# Patient Record
Sex: Male | Born: 1975 | Race: White | Hispanic: No | Marital: Single | Smoking: Current every day smoker
Health system: Southern US, Community
[De-identification: ages and names within clinical notes are randomized; demographics above are authoritative.]

## PROBLEM LIST (undated history)

## (undated) HISTORY — PX: ANKLE SURGERY: SHX546

## (undated) HISTORY — PX: NECK SURGERY: SHX720

---

## 1998-05-23 ENCOUNTER — Inpatient Hospital Stay (HOSPITAL_COMMUNITY): Admission: EM | Admit: 1998-05-23 | Discharge: 1998-05-26 | Payer: Self-pay | Admitting: Emergency Medicine

## 1998-05-23 ENCOUNTER — Encounter: Payer: Self-pay | Admitting: Emergency Medicine

## 1998-05-24 ENCOUNTER — Encounter: Payer: Self-pay | Admitting: Neurosurgery

## 1998-05-25 ENCOUNTER — Encounter: Payer: Self-pay | Admitting: Neurosurgery

## 2012-04-30 ENCOUNTER — Encounter (HOSPITAL_COMMUNITY): Payer: Self-pay | Admitting: Cardiology

## 2012-04-30 ENCOUNTER — Emergency Department (HOSPITAL_COMMUNITY)
Admission: EM | Admit: 2012-04-30 | Discharge: 2012-04-30 | Discharge status: Against Medical Advice | Payer: Self-pay | Attending: Emergency Medicine | Admitting: Emergency Medicine

## 2012-04-30 DIAGNOSIS — M795 Residual foreign body in soft tissue: Secondary | ICD-10-CM | POA: Insufficient documentation

## 2012-04-30 DIAGNOSIS — L089 Local infection of the skin and subcutaneous tissue, unspecified: Secondary | ICD-10-CM | POA: Insufficient documentation

## 2012-04-30 DIAGNOSIS — X58XXXA Exposure to other specified factors, initial encounter: Secondary | ICD-10-CM | POA: Insufficient documentation

## 2012-04-30 DIAGNOSIS — Y939 Activity, unspecified: Secondary | ICD-10-CM | POA: Insufficient documentation

## 2012-04-30 DIAGNOSIS — Y990 Civilian activity done for income or pay: Secondary | ICD-10-CM | POA: Insufficient documentation

## 2012-04-30 DIAGNOSIS — F17200 Nicotine dependence, unspecified, uncomplicated: Secondary | ICD-10-CM | POA: Insufficient documentation

## 2012-04-30 DIAGNOSIS — T148XXA Other injury of unspecified body region, initial encounter: Secondary | ICD-10-CM | POA: Insufficient documentation

## 2012-04-30 DIAGNOSIS — Y9269 Other specified industrial and construction area as the place of occurrence of the external cause: Secondary | ICD-10-CM | POA: Insufficient documentation

## 2012-04-30 NOTE — ED Notes (Signed)
Pt not in room when transporter came to transport for X ray, will recheck x 5 mins to assess if the pt has returned to the room

## 2012-04-30 NOTE — ED Notes (Signed)
Walnut size lump on rt upper arm that has been getting redder since Friday was seen recently for back pain pt  Denies itching or injury

## 2012-04-30 NOTE — ED Notes (Signed)
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 Small knot and redness noted to the area.

## 2012-04-30 NOTE — ED Notes (Addendum)
Pt is not in room, pt removed LWBS in system

## 2012-07-01 ENCOUNTER — Emergency Department (HOSPITAL_COMMUNITY)
Admission: EM | Admit: 2012-07-01 | Discharge: 2012-07-01 | Disposition: A | Discharge status: Home / Self Care | Payer: Self-pay | Attending: Emergency Medicine | Admitting: Emergency Medicine

## 2012-07-01 ENCOUNTER — Encounter (HOSPITAL_COMMUNITY): Payer: Self-pay | Admitting: Emergency Medicine

## 2012-07-01 DIAGNOSIS — F17200 Nicotine dependence, unspecified, uncomplicated: Secondary | ICD-10-CM | POA: Insufficient documentation

## 2012-07-01 DIAGNOSIS — M545 Low back pain, unspecified: Secondary | ICD-10-CM | POA: Insufficient documentation

## 2012-07-01 MED ORDER — KETOROLAC TROMETHAMINE 60 MG/2ML IM SOLN
60.0000 mg | Freq: Once | INTRAMUSCULAR | Status: AC
  Administered 2012-07-01: 60 mg via INTRAMUSCULAR
  Filled 2012-07-01: qty 2

## 2012-07-01 MED ORDER — METHOCARBAMOL 500 MG PO TABS
500.0000 mg | ORAL_TABLET | Freq: Once | ORAL | Status: AC
  Administered 2012-07-01: 500 mg via ORAL
  Filled 2012-07-01: qty 1

## 2012-07-01 MED ORDER — METHOCARBAMOL 500 MG PO TABS: 500.0000 mg | ORAL_TABLET | Freq: Two times a day (BID) | ORAL | Status: DC

## 2012-07-01 MED ORDER — TRAMADOL HCL 50 MG PO TABS: 50.0000 mg | ORAL_TABLET | Freq: Four times a day (QID) | ORAL | Status: DC | PRN

## 2012-07-01 MED ORDER — IBUPROFEN 600 MG PO TABS: 600.0000 mg | ORAL_TABLET | Freq: Four times a day (QID) | ORAL | Status: DC | PRN

## 2012-07-01 NOTE — ED Notes (Signed)
Pt reports about 2 months ago while he was at work he was lifting some heavy tired and putting them on a trailer and felt liket he twisted something, didn't hurt too bad then a few days later became in more pain went to an urgent care and was given prednisone and robaxin but didn't get much relief, sts no xray was performed. Pt sts he has even bought oxycodone off the street because the pain is so bad. Pt sts he thinks its more than just a pulled muscle. Pt c/o pain to lower back. Pt in nad, skin warm and dry, resp e/u. Ambulatory to room.

## 2012-07-01 NOTE — ED Provider Notes (Signed)
History  This chart was scribed for Jerry Racer, MD by Shari Heritage, ED Scribe. The patient was seen in room TR04C/TR04C. Patient's care was started at 1657.   CSN: [id number]  Arrival date & time 07/01/12  1549   First MD Initiated Contact with Patient 07/01/12 1657      Chief Complaint  Patient presents with  . Back Pain    Patient is a 37 y.o. male presenting with back pain. The history is provided by the patient. No language interpreter was used.  Back Pain Location:  Lumbar spine Radiates to:  L shoulder Pain severity now: moderate to severe. Pain is:  Same all the time Duration:  4 months Timing:  Intermittent Progression:  Waxing and waning Chronicity:  Recurrent Context: occupational injury   Ineffective treatments: prednisone, Meloxicam. Associated symptoms: no bladder incontinence, no bowel incontinence, no fever, no numbness and no weakness     HPI Comments: [name] is a 37 y.o. male who presents to the Emergency Department complaining of intermittent, waxing and waning, moderate to severe lower back pain for the past 4 months. 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 He says that pain gradually worsened for several hours after lifting a tire at work. Patient reports that pain began on the right side and is now bilateral. He states that pain is worse with ambulation and certain movements. He has been seen for this problem and was treated with meloxicam and prednisone twice, but pain has not improved. He denies bowel or bladder incontinence, numbness and weakness of extremities or fever and chills. Patient says that he has been unable to work due to severity of the pain. He has not seen an orthopedist or neurologist for this problem due to lack of funds or insurance. He says that he is in the process of filing workman's compensation. Patient has no chronic medical conditions. He is a current every day smoker.  History reviewed. No pertinent past  medical history.  Past Surgical History  Procedure Laterality Date  . Neck surgery    . Ankle surgery      History reviewed. No pertinent family history.  History  Substance Use Topics  . Smoking status: Current Every Day Smoker  . Smokeless tobacco: Not on file  . Alcohol Use: Yes      Review of Systems  Constitutional: Negative for fever and chills.  Gastrointestinal: Negative for bowel incontinence.  Genitourinary: Negative for bladder incontinence.  Musculoskeletal: Positive for back pain.  Neurological: Negative for weakness and numbness.    Allergies  Review of patient's allergies indicates no known allergies.  Home Medications   Current Outpatient Rx  Name  Route  Sig  Dispense  Refill  . ibuprofen (ADVIL,MOTRIN) 600 MG tablet   Oral   Take 1 tablet (600 mg total) by mouth every 6 (six) hours as needed for pain.   30 tablet   0   . methocarbamol (ROBAXIN) 500 MG tablet   Oral   Take 1 tablet (500 mg total) by mouth 2 (two) times daily.   20 tablet   0   . traMADol (ULTRAM) 50 MG tablet   Oral   Take 1 tablet (50 mg total) by mouth every 6 (six) hours as needed for pain.   15 tablet   0     Triage Vitals: BP 117/80  Pulse 89  Temp(Src) 98.2 F (36.8 C)  Resp 21  SpO2 98%  Physical Exam  Constitutional: He is  oriented to person, place, and time. He appears well-developed and well-nourished.  HENT:  Head: Normocephalic and atraumatic.  Eyes: Conjunctivae and EOM are normal. Pupils are equal, round, and reactive to light.  Neck: Normal range of motion. Neck supple.  Pulmonary/Chest: No respiratory distress.  Musculoskeletal: Normal range of motion. He exhibits tenderness.  Generalized lumbar tenderness to palpation. No midline tenderness. Negative straight leg raise bilaterally. Neurovascularly intact.   Neurological: He is alert and oriented to person, place, and time.  Skin: Skin is warm and dry.    ED Course  Procedures (including  critical care time) DIAGNOSTIC STUDIES: Oxygen Saturation is 98% on room air, normal by my interpretation.    COORDINATION OF CARE: 5:06 PM- Patient informed of current plan for treatment and evaluation and agrees with plan at this time.    1. Low back pain       MDM  I personally performed the services described in this documentation, which was scribed in my presence. The recorded information has been reviewed and is accurate.     Jerry Racer, MD 07/01/12 Mikle [place]worth

## 2012-07-01 NOTE — ED Notes (Signed)
Pt c/o lower back pain x 2 months after lifting tire at work

## 2012-09-14 ENCOUNTER — Encounter (HOSPITAL_COMMUNITY): Payer: Self-pay | Admitting: Physical Medicine and Rehabilitation

## 2012-09-14 ENCOUNTER — Emergency Department (HOSPITAL_COMMUNITY)
Admission: EM | Admit: 2012-09-14 | Discharge: 2012-09-14 | Disposition: A | Discharge status: Home / Self Care | Payer: Self-pay | Attending: Emergency Medicine | Admitting: Emergency Medicine

## 2012-09-14 DIAGNOSIS — IMO0002 Reserved for concepts with insufficient information to code with codable children: Secondary | ICD-10-CM | POA: Insufficient documentation

## 2012-09-14 DIAGNOSIS — L0291 Cutaneous abscess, unspecified: Secondary | ICD-10-CM

## 2012-09-14 DIAGNOSIS — F17200 Nicotine dependence, unspecified, uncomplicated: Secondary | ICD-10-CM | POA: Insufficient documentation

## 2012-09-14 MED ORDER — OXYCODONE-ACETAMINOPHEN 5-325 MG PO TABS: 1.0000 | ORAL_TABLET | Freq: Four times a day (QID) | ORAL | Status: DC | PRN

## 2012-09-14 NOTE — ED Notes (Signed)
PA at bedside for I & D.

## 2012-09-14 NOTE — ED Provider Notes (Signed)
CSN: [id number]     Arrival date & time 09/14/12  1222 History     First MD Initiated Contact with Patient 09/14/12 1258     Chief Complaint  Patient presents with  . Abscess   (Consider location/radiation/quality/duration/timing/severity/associated sxs/prior Treatment) HPI Comments: Patient presents with an abscess of his left forearm.  He reports that the abscess has been present for the past month and is gradually worsening.  He was seen in the ED in [place] for this three days ago.  At that time he was given a prescription for a ten day course of Bactrim DS, which he states he has been taking.  He was also given a prescription for Percocet for the pain, which he reports did help.  However, he ran out of the medication.   He reports no improvement in the abscess since that time.  He denies any drainage from the area.  Denies fever or chills.  Denies nausea or vomiting.  He has full ROM of his left elbow.  He denies prior history of DM or HIV.  Patient is a 37 y.o. male presenting with abscess. The history is provided by the patient.  Abscess Associated symptoms: no fever     No past medical history on file. Past Surgical History  Procedure Laterality Date  . Neck surgery    . Ankle surgery     History reviewed. No pertinent family history. History  Substance Use Topics  . Smoking status: Current Every Day Smoker  . Smokeless tobacco: Not on file  . Alcohol Use: Yes     Comment: socially    Review of Systems  Constitutional: Negative for fever and chills.  Skin:       abscess  All other systems reviewed and are negative.    Allergies  Review of patient's allergies indicates no known allergies.  Home Medications  No current outpatient prescriptions on file. BP 124/72  Pulse 105  Temp(Src) 98.2 F (36.8 C) (Oral)  Resp 18  SpO2 100% Physical Exam  Nursing note and vitals reviewed. Constitutional: He is oriented to person, place, and time. He appears  well-developed and well-nourished.  HENT:  Head: Normocephalic and atraumatic.  Neck: Normal range of motion. Neck supple.  Cardiovascular: Normal rate, regular rhythm and normal heart sounds.   Pulses:      Radial pulses are 2+ on the right side, and 2+ on the left side.  Pulmonary/Chest: Effort normal and breath sounds normal.  Musculoskeletal: Normal range of motion.  Full ROM of the left elbow  Neurological: He is alert and oriented to person, place, and time. No sensory deficit.  Skin: Skin is warm and dry.     Psychiatric: He has a normal mood and affect.    ED Course   Procedures (including critical care time)  Labs Reviewed - No data to display No results found. No diagnosis found.  INCISION AND DRAINAGE Performed by: Anne Shutter, Cadan Maggart Consent: Verbal consent obtained. Risks and benefits: risks, benefits and alternatives were discussed Type: abscess  Body area: left forearm  Anesthesia: local infiltration  Incision was made with a scalpel.  Local anesthetic: lidocaine 2% with epinephrine  Anesthetic total:4  ml  Complexity: complex Blunt dissection to break up loculations  Drainage: purulent  Drainage amount: copious   Patient tolerance: Patient tolerated the procedure well with no immediate complications.    MDM  Patient with skin abscess amenable to incision and drainage.  Patient is afebrile.  Full ROM of  the left elbow.   Encouraged warm compresses.    Mild signs of cellulitis is surrounding skin.  Will d/c to home.  Patient instructed to continue his antibiotic.  Strict return precautions given.  Pascal Lux [place], PA-C 09/14/12 1601

## 2012-09-14 NOTE — ED Notes (Signed)
Pt presents to department for evaluation of L arm abscess. Pt states raised, red and swollen area to L elbow region. Has been taking antibiotics and pain medication without relief. Now states area is warm and touch and becoming more painful. 10/10 pain at the time. No drainage noted.

## 2012-09-19 NOTE — ED Provider Notes (Signed)
Medical screening examination/treatment/procedure(s) were performed by non-physician practitioner and as supervising physician I was immediately available for consultation/collaboration.   Charles B. Sheldon, MD 09/19/12 0720 

## 2014-04-24 ENCOUNTER — Emergency Department (HOSPITAL_COMMUNITY): Payer: Self-pay

## 2014-04-24 ENCOUNTER — Emergency Department (HOSPITAL_COMMUNITY)
Admission: EM | Admit: 2014-04-24 | Discharge: 2014-04-24 | Disposition: A | Discharge status: Home / Self Care | Payer: Self-pay | Attending: Emergency Medicine | Admitting: Emergency Medicine

## 2014-04-24 ENCOUNTER — Encounter (HOSPITAL_COMMUNITY): Payer: Self-pay | Admitting: *Deleted

## 2014-04-24 DIAGNOSIS — S40011A Contusion of right shoulder, initial encounter: Secondary | ICD-10-CM | POA: Insufficient documentation

## 2014-04-24 DIAGNOSIS — S20211A Contusion of right front wall of thorax, initial encounter: Secondary | ICD-10-CM | POA: Insufficient documentation

## 2014-04-24 DIAGNOSIS — S20219A Contusion of unspecified front wall of thorax, initial encounter: Secondary | ICD-10-CM

## 2014-04-24 DIAGNOSIS — Y998 Other external cause status: Secondary | ICD-10-CM | POA: Insufficient documentation

## 2014-04-24 DIAGNOSIS — Y9389 Activity, other specified: Secondary | ICD-10-CM | POA: Insufficient documentation

## 2014-04-24 DIAGNOSIS — Z720 Tobacco use: Secondary | ICD-10-CM | POA: Insufficient documentation

## 2014-04-24 DIAGNOSIS — Y92410 Unspecified street and highway as the place of occurrence of the external cause: Secondary | ICD-10-CM | POA: Insufficient documentation

## 2014-04-24 DIAGNOSIS — S20212A Contusion of left front wall of thorax, initial encounter: Secondary | ICD-10-CM | POA: Insufficient documentation

## 2014-04-24 MED ORDER — HYDROCODONE-ACETAMINOPHEN 5-325 MG PO TABS: 2.0000 | ORAL_TABLET | ORAL | Status: DC | PRN

## 2014-04-24 MED ORDER — CYCLOBENZAPRINE HCL 10 MG PO TABS: 10.0000 mg | ORAL_TABLET | Freq: Three times a day (TID) | ORAL | Status: DC | PRN

## 2014-04-24 NOTE — ED Notes (Signed)
Radial pulses palpated bilaterally. No obvious signs of deformity or bruising noted. Abrasion noted to RLL.
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
## 2014-04-24 NOTE — Discharge Instructions (Signed)
Chest Contusion A chest contusion is a deep bruise on your chest area. Contusions are the result of an injury that caused bleeding under the skin. A chest contusion may involve bruising of the skin, muscles, or ribs. The contusion may turn blue, purple, or yellow. Minor injuries will give you a painless contusion, but more severe contusions may stay painful and swollen for a few weeks. CAUSES  A contusion is usually caused by a blow, trauma, or direct force to an area of the body. SYMPTOMS   Swelling and redness of the injured area.  Discoloration of the injured area.  Tenderness and soreness of the injured area.  Pain. DIAGNOSIS  The diagnosis can be made by taking a history and performing a physical exam. An X-ray, CT scan, or MRI may be needed to determine if there were any associated injuries, such as broken bones (fractures) or internal injuries. TREATMENT  Often, the best treatment for a chest contusion is resting, icing, and applying cold compresses to the injured area. Deep breathing exercises may be recommended to reduce the risk of pneumonia. Over-the-counter medicines may also be recommended for pain control. HOME CARE INSTRUCTIONS   Put ice on the injured area.  Put ice in a plastic bag.  Place a towel between your skin and the bag.  Leave the ice on for 15-20 minutes, 03-04 times a day.  Only take over-the-counter or prescription medicines as directed by your caregiver. Your caregiver may recommend avoiding anti-inflammatory medicines (aspirin, ibuprofen, and naproxen) for 48 hours because these medicines may increase bruising.  Rest the injured area.  Perform deep-breathing exercises as directed by your caregiver.  Stop smoking if you smoke.  Do not lift objects over 5 pounds (2.3 kg) for 3 days or longer if recommended by your caregiver. SEEK IMMEDIATE MEDICAL CARE IF:   You have increased bruising or swelling.  You have pain that is getting worse.  You have  difficulty breathing.  You have dizziness, weakness, or fainting.  You have blood in your urine or stool.  You cough up or vomit blood.  Your swelling or pain is not relieved with medicines. MAKE SURE YOU:   Understand these instructions.  Will watch your condition.  Will get help right away if you are not doing well or get worse. Document Released: 10/25/2000 Document Revised: 10/25/2011 Document Reviewed: 07/24/2011 [facility] Patient Information 2015 [employer] [place]. This information is not intended to replace advice given to you by your health care provider. Make sure you discuss any questions you have with your health care provider.  Blunt Trauma You have been evaluated for injuries. You have been examined and your caregiver has not found injuries serious enough to require hospitalization. It is common to have multiple bruises and sore muscles following an accident. These tend to feel worse for the first 24 hours. You will feel more stiffness and soreness over the next several hours and worse when you wake up the first morning after your accident. After this point, you should begin to improve with each passing day. The amount of improvement depends on the amount of damage done in the accident. Following your accident, if some part of your body does not work as it should, or if the pain in any area continues to increase, you should return to the Emergency Department for re-evaluation.  HOME CARE INSTRUCTIONS  Routine care for sore areas should include:  Ice to sore areas every 2 hours for 20 minutes while awake for the next  2 days.  Drink extra fluids (not alcohol).  Take a hot or warm shower or bath once or twice a day to increase blood flow to sore muscles. This will help you "limber up".  Activity as tolerated. Lifting may aggravate neck or back pain.  Only take over-the-counter or prescription medicines for pain, discomfort, or fever as directed by your caregiver. Do not use  aspirin. This may increase bruising or increase bleeding if there are small areas where this is happening. SEEK IMMEDIATE MEDICAL CARE IF:  Numbness, tingling, weakness, or problem with the use of your arms or legs.  A severe headache is not relieved with medications.  There is a change in bowel or bladder control.  Increasing pain in any areas of the body.  Short of breath or dizzy.  Nauseated, vomiting, or sweating.  Increasing belly (abdominal) discomfort.  Blood in urine, stool, or vomiting blood.  Pain in either shoulder in an area where a shoulder strap would be.  Feelings of lightheadedness or if you have a fainting episode. Sometimes it is not possible to identify all injuries immediately after the trauma. It is important that you continue to monitor your condition after the emergency department visit. If you feel you are not improving, or improving more slowly than should be expected, call your physician. If you feel your symptoms (problems) are worsening, return to the Emergency Department immediately. Document Released: 10/26/2000 Document Revised: 04/24/2011 Document Reviewed: 09/18/2007 [facility] Patient Information 2015 [place], [place]. This information is not intended to replace advice given to you by your health care provider. Make sure you discuss any questions you have with your health care provider.

## 2014-04-24 NOTE — ED Notes (Signed)
Pt states it also hurts to take in a deep breath.

## 2014-04-24 NOTE — ED Notes (Signed)
Pt is in stable condition upon d/c and ambulates from ED with staff. 
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
## 2014-04-26 ENCOUNTER — Emergency Department (HOSPITAL_COMMUNITY)
Admission: EM | Admit: 2014-04-26 | Discharge: 2014-04-26 | Disposition: A | Discharge status: Home / Self Care | Payer: PRIVATE HEALTH INSURANCE | Attending: Emergency Medicine | Admitting: Emergency Medicine

## 2014-04-26 ENCOUNTER — Encounter (HOSPITAL_COMMUNITY): Payer: Self-pay | Admitting: Neurology

## 2014-04-26 ENCOUNTER — Emergency Department (HOSPITAL_COMMUNITY): Payer: Self-pay

## 2014-04-26 DIAGNOSIS — Z720 Tobacco use: Secondary | ICD-10-CM | POA: Insufficient documentation

## 2014-04-26 DIAGNOSIS — F419 Anxiety disorder, unspecified: Secondary | ICD-10-CM | POA: Insufficient documentation

## 2014-04-26 DIAGNOSIS — S3991XA Unspecified injury of abdomen, initial encounter: Secondary | ICD-10-CM | POA: Insufficient documentation

## 2014-04-26 DIAGNOSIS — R410 Disorientation, unspecified: Secondary | ICD-10-CM | POA: Diagnosis not present

## 2014-04-26 DIAGNOSIS — Y9389 Activity, other specified: Secondary | ICD-10-CM | POA: Insufficient documentation

## 2014-04-26 DIAGNOSIS — F1310 Sedative, hypnotic or anxiolytic abuse, uncomplicated: Secondary | ICD-10-CM | POA: Insufficient documentation

## 2014-04-26 DIAGNOSIS — S299XXA Unspecified injury of thorax, initial encounter: Secondary | ICD-10-CM | POA: Insufficient documentation

## 2014-04-26 DIAGNOSIS — F1210 Cannabis abuse, uncomplicated: Secondary | ICD-10-CM | POA: Insufficient documentation

## 2014-04-26 DIAGNOSIS — Y998 Other external cause status: Secondary | ICD-10-CM | POA: Insufficient documentation

## 2014-04-26 DIAGNOSIS — Y92410 Unspecified street and highway as the place of occurrence of the external cause: Secondary | ICD-10-CM | POA: Insufficient documentation

## 2014-04-26 DIAGNOSIS — S3992XA Unspecified injury of lower back, initial encounter: Secondary | ICD-10-CM | POA: Insufficient documentation

## 2014-04-26 DIAGNOSIS — S060X1A Concussion with loss of consciousness of 30 minutes or less, initial encounter: Secondary | ICD-10-CM | POA: Insufficient documentation

## 2014-04-26 LAB — COMPREHENSIVE METABOLIC PANEL
ALBUMIN: 3.5 g/dL (ref 3.5–5.2)
ALT: 28 U/L (ref 0–53)
ANION GAP: 10 (ref 5–15)
AST: 23 U/L (ref 0–37)
Alkaline Phosphatase: 56 U/L (ref 39–117)
CHLORIDE: 96 mmol/L (ref 96–112)
CO2: 28 mmol/L (ref 19–32)
Calcium: 8.8 mg/dL (ref 8.4–10.5)
Creatinine, Ser: 0.66 mg/dL (ref 0.50–1.35)
GFR calc non Af Amer: >90 mL/min (ref >90)
GLUCOSE: 128 mg/dL — AB (ref 70–99)
POTASSIUM: 3.5 mmol/L (ref 3.5–5.1)
SODIUM: 134 mmol/L — AB (ref 135–145)
TOTAL PROTEIN: 7.2 g/dL (ref 6.0–8.3)
Total Bilirubin: 0.8 mg/dL (ref 0.3–1.2)

## 2014-04-26 LAB — CBC WITH DIFFERENTIAL/PLATELET
BASOS ABS: 0 10*3/uL (ref 0.0–0.1)
Basophils Relative: 0 % (ref 0–1)
EOS ABS: 0 10*3/uL (ref 0.0–0.7)
EOS PCT: 0 % (ref 0–5)
HEMATOCRIT: 43.6 % (ref 39.0–52.0)
HEMOGLOBIN: 14.8 g/dL (ref 13.0–17.0)
LYMPHS PCT: 11 % — AB (ref 12–46)
Lymphs Abs: 0.7 10*3/uL (ref 0.7–4.0)
MCH: 31.2 pg (ref 26.0–34.0)
MCHC: 33.9 g/dL (ref 30.0–36.0)
MCV: 91.8 fL (ref 78.0–100.0)
MONO ABS: 0.4 10*3/uL (ref 0.1–1.0)
Monocytes Relative: 6 % (ref 3–12)
NEUTROS ABS: 5.6 10*3/uL (ref 1.7–7.7)
NEUTROS PCT: 83 % — AB (ref 43–77)
PLATELETS: 219 10*3/uL (ref 150–400)
RBC: 4.75 MIL/uL (ref 4.22–5.81)
RDW: 13.5 % (ref 11.5–15.5)
WBC: 6.7 10*3/uL (ref 4.0–10.5)

## 2014-04-26 LAB — I-STAT CG4 LACTIC ACID, ED: LACTIC ACID, VENOUS: 2.08 mmol/L — AB (ref 0.5–2.0)

## 2014-04-26 LAB — URINALYSIS, ROUTINE W REFLEX MICROSCOPIC
GLUCOSE, UA: NEGATIVE mg/dL
HGB URINE DIPSTICK: NEGATIVE
KETONES UR: 15 mg/dL — AB
Nitrite: NEGATIVE
PROTEIN: NEGATIVE mg/dL
Specific Gravity, Urine: 1.023 (ref 1.005–1.030)
Urobilinogen, UA: 1 mg/dL (ref 0.0–1.0)
pH: 5.5 (ref 5.0–8.0)

## 2014-04-26 LAB — RAPID URINE DRUG SCREEN, HOSP PERFORMED
Amphetamines: NOT DETECTED
Barbiturates: NOT DETECTED
Benzodiazepines: POSITIVE — AB
COCAINE: NOT DETECTED
OPIATES: POSITIVE — AB
TETRAHYDROCANNABINOL: POSITIVE — AB

## 2014-04-26 LAB — URINE MICROSCOPIC-ADD ON

## 2014-04-26 LAB — TROPONIN I: Troponin I: <0.03 ng/mL (ref <0.031)

## 2014-04-26 MED ORDER — HYDROCODONE-ACETAMINOPHEN 10-325 MG PO TABS: 1.0000 | ORAL_TABLET | Freq: Once | ORAL | Status: DC

## 2014-04-26 MED ORDER — KETOROLAC TROMETHAMINE 30 MG/ML IJ SOLN
30.0000 mg | Freq: Once | INTRAMUSCULAR | Status: AC
  Administered 2014-04-26: 30 mg via INTRAVENOUS
  Filled 2014-04-26: qty 1

## 2014-04-26 MED ORDER — SODIUM CHLORIDE 0.9 % IV BOLUS (SEPSIS)
1000.0000 mL | Freq: Once | INTRAVENOUS | Status: AC
  Administered 2014-04-26: 1000 mL via INTRAVENOUS

## 2014-04-26 MED ORDER — DIAZEPAM 5 MG PO TABS
5.0000 mg | ORAL_TABLET | Freq: Once | ORAL | Status: AC
  Administered 2014-04-26: 5 mg via ORAL
  Filled 2014-04-26: qty 1

## 2014-04-26 MED ORDER — FENTANYL CITRATE 0.05 MG/ML IJ SOLN
50.0000 ug | Freq: Once | INTRAMUSCULAR | Status: AC
  Administered 2014-04-26: 50 ug via INTRAVENOUS
  Filled 2014-04-26: qty 2

## 2014-04-26 NOTE — ED Notes (Signed)
Family out to RN station, reports pt told them the doctor told him he was going to go home. MD did not say this to patient. Family also reporting, pt is referring to a family friend's girl friend who he is referencing to sitting in the room. Only his mom and dad are noted at bedside. Pt reports "I've got a lot going on. I want to be working:".

## 2014-04-26 NOTE — Discharge Instructions (Signed)
We saw you in the ER for the confusion. All the results in the ER are normal, labs and imaging. We suspect concussion.  The workup in the ER is not complete, and is limited to screening for life threatening and emergent conditions only, so please see a primary care doctor for further evaluation.  RESOURCE GUIDE  Chronic Pain Problems: Contact [facility]  [phone] Patients need to be referred by their primary care doctor.  Insufficient Money for Medicine: Contact [employer]:  call "211."   No Primary Care Doctor: - Call [facility]  [phone] - can help you locate a primary care doctor that  accepts your insurance, provides certain services, etc. - Physician Referral Service- [phone]  Agencies that provide inexpensive medical care: - [facility]  [phone] - [facility]  [phone] - [facility]  [phone] - Women's Clinic  [phone] - [facility]  [phone] [facility]  [phone]  Medicaid-accepting [facility] Providers: - [facility]- [address]  [phone], Mon-Fri 9am-7pm, Sat 9am-1pm - [facility]- [address] [place], [address] [place]  [phone] - [facility]- [address] [place]  [phone] [facility]- [address]  [phone] - Renaye Rakers- [address] [place], [address], [phone]  Only accepts [place] patients after they have their name  applied to their card  Self Pay (no insurance) in [place]: - Sickle Cell Patients: Dr Willey Blade, [facility]  [address] [place], [phone] - [facility]- [address] [place]  [phone]       [facility] [place]- [address]       -     [facility]- see information above (Speak to [employer] if you do not have insurance)       -  [facility]-  [address],  [phone]       -  [facility]- [address], [phone]       -  Dr Julio Sicks-  [address], [place], [phone]       -  [facility] - [address], [phone]       -  [facility]- [address] [phone], also [address] [phone]       -    [facility]- [address] [place], [phone], 1st & 3rd Saturday        every month, 10am-1pm  [facility] [address] [place], [place] [address] [phone]  [facility] [address] [place], [place] [address] [phone]  1) Find a Doctor and Pay Out of Pocket Although you won't have to find out who is covered by your insurance plan, it is a good idea to ask around and get recommendations. You will then need to call the office and see if the doctor you have chosen will accept you as a new patient and what types of options they offer for patients who are self-pay. Some doctors offer discounts or will set up payment plans for their patients who do not have insurance, but you will need to ask so you aren't surprised when you get to your appointment.  2)  Contact Your Local Health Department Not all health departments have doctors that can see patients for sick visits, but many do, so it is worth a call to see if yours does. If you don't know where your local health department is, you can check in your phone book. The CDC also has a tool to help you locate your state's health department, and many state websites also have listings of all of their local health departments.  3) Find a Walk-in Clinic If your illness is not likely to be very severe or complicated, you may want to try a walk in clinic. These are popping up all over the country in pharmacies, drugstores, and shopping centers. They're usually staffed by nurse practitioners or physician assistants that have been trained to treat common  illnesses and complaints. They're usually fairly quick and inexpensive. However, if you have serious medical issues or chronic medical problems, these are probably not your best option  STD Testing - [facility] of [facility], STD Clinic, [address] [place], phone [phone] or [phone].  Monday - Friday, call for an appointment. [facility] of [employer], STD Clinic, [address], [place], phone [phone] or [phone].  Monday - Friday, call for an appointment.  Abuse/Neglect: [facility] Child Abuse Hotline 760-084-0719 [facility] Child Abuse Hotline [phone] (After Hours)  Emergency Shelter:  [facility] 720-335-5464  Maternity Homes: - [facility] 940-017-9073 - [facility] [phone]  MRSA Hotline #:   540-666-1707  Dental Assistance If unable to pay or uninsured, contact:  [facility]. to become qualified for the adult dental clinic.  Patients with Medicaid: [facility] [phone], [phone] [address], [phone]  If unable to pay, or uninsured, contact [facility] [phone] in [place], [phone] in [facility]) to become qualified for the adult dental clinic  [facility] [address] [place], [place] [address] [phone] [email or web address]  Other Proofreader Services: - [facility]- [address] [place], [place], [place], [address], [phone], [address]. [phone], 2nd and 4th Thursday of the month at 6:30am.  10 clients each day by appointment, can sometimes see walk-in patients if someone does not show for an appointment. [facility]- [address] [place], [place], [address], [phone] - [facility]- [address], [place], [place], [address], [phone] [facility] Health  Department- [phone] [facility] Health Department- [phone] [facility] [phone]          Concussion A concussion, or closed-head injury, is a brain injury caused by a direct blow to the head or by a quick and sudden movement (jolt) of the head or neck. Concussions are usually not life-threatening. Even so, the effects of a concussion can be serious. If you have had a concussion before, you are more likely to experience concussion-like symptoms after a direct blow to the head.  CAUSES  Direct blow to the head, such as from running into another player during a soccer game, being hit in a fight, or hitting your head on a hard surface.  A jolt of the head or neck that causes the brain to move back and forth inside the skull, such as in a car crash. SIGNS AND SYMPTOMS The signs of a concussion can be hard to notice. Early on, they  may be missed by you, family members, and health care providers. You may look fine but act or feel differently. Symptoms are usually temporary, but they may last for days, weeks, or even longer. Some symptoms may appear right away while others may not show up for hours or days. Every head injury is different. Symptoms include:  Mild to moderate headaches that will not go away.  A feeling of pressure inside your head.  Having more trouble than usual:  Learning or remembering things you have heard.  Answering questions.  Paying attention or concentrating.  Organizing daily tasks.  Making decisions and solving problems.  Slowness in thinking, acting or reacting, speaking, or reading.  Getting lost or being easily confused.  Feeling tired all the time or lacking energy (fatigued).  Feeling drowsy.  Sleep disturbances.  Sleeping more than usual.  Sleeping less than usual.  Trouble falling asleep.  Trouble sleeping (insomnia).  Loss of balance or feeling lightheaded or dizzy.  Nausea or vomiting.  Numbness or  tingling.  Increased sensitivity to:  Sounds.  Lights.  Distractions.  Vision problems or eyes that tire easily.  Diminished sense of taste or smell.  Ringing in the ears.  Mood changes such as feeling sad or anxious.  Becoming easily irritated or angry for little or no reason.  Lack of motivation.  Seeing or hearing things other people do not see or hear (hallucinations). DIAGNOSIS Your health care provider can usually diagnose a concussion based on a description of your injury and symptoms. He or she will ask whether you passed out (lost consciousness) and whether you are having trouble remembering events that happened right before and during your injury. Your evaluation might include:  A brain scan to look for signs of injury to the brain. Even if the test shows no injury, you may still have a concussion.  Blood tests to be sure other problems are not present. TREATMENT  Concussions are usually treated in an emergency department, in urgent care, or at a clinic. You may need to stay in the hospital overnight for further treatment.  Tell your health care provider if you are taking any medicines, including prescription medicines, over-the-counter medicines, and natural remedies. Some medicines, such as blood thinners (anticoagulants) and aspirin, may increase the chance of complications. Also tell your health care provider whether you have had alcohol or are taking illegal drugs. This information may affect treatment.  Your health care provider will send you home with important instructions to follow.  How fast you will recover from a concussion depends on many factors. These factors include how severe your concussion is, what part of your brain was injured, your age, and how healthy you were before the concussion.  Most people with mild injuries recover fully. Recovery can take time. In general, recovery is slower in older persons. Also, persons who have had a concussion in  the past or have other medical problems may find that it takes longer to recover from their current injury. HOME CARE INSTRUCTIONS General Instructions  Carefully follow the directions your health care provider gave you.  Only take over-the-counter or prescription medicines for pain, discomfort, or fever as directed by your health care provider.  Take only those medicines that your health care provider has approved.  Do not drink alcohol until your health care provider says you are well enough to do so. Alcohol and certain other drugs may slow your recovery and can put you at risk of further injury.  If  it is harder than usual to remember things, write them down.  If you are easily distracted, try to do one thing at a time. For example, do not try to watch TV while fixing dinner.  Talk with family members or close friends when making important decisions.  Keep all follow-up appointments. Repeated evaluation of your symptoms is recommended for your recovery.  Watch your symptoms and tell others to do the same. Complications sometimes occur after a concussion. Older adults with a brain injury may have a higher risk of serious complications, such as a blood clot on the brain.  Tell your teachers, school nurse, school counselor, coach, athletic trainer, or work Production designer, theatre/television/film about your injury, symptoms, and restrictions. Tell them about what you can or cannot do. They should watch for:  Increased problems with attention or concentration.  Increased difficulty remembering or learning new information.  Increased time needed to complete tasks or assignments.  Increased irritability or decreased ability to cope with stress.  Increased symptoms.  Rest. Rest helps the brain to heal. Make sure you:  Get plenty of sleep at night. Avoid staying up late at night.  Keep the same bedtime hours on weekends and weekdays.  Rest during the day. Take daytime naps or rest breaks when you feel  tired.  Limit activities that require a lot of thought or concentration. These include:  Doing homework or job-related work.  Watching TV.  Working on the computer.  Avoid any situation where there is potential for another head injury (football, hockey, soccer, basketball, martial arts, downhill snow sports and horseback riding). Your condition will get worse every time you experience a concussion. You should avoid these activities until you are evaluated by the appropriate follow-up health care providers. Returning To Your Regular Activities You will need to return to your normal activities slowly, not all at once. You must give your body and brain enough time for recovery.  Do not return to sports or other athletic activities until your health care provider tells you it is safe to do so.  Ask your health care provider when you can drive, ride a bicycle, or operate heavy machinery. Your ability to react may be slower after a brain injury. Never do these activities if you are dizzy.  Ask your health care provider about when you can return to work or school. Preventing Another Concussion It is very important to avoid another brain injury, especially before you have recovered. In rare cases, another injury can lead to permanent brain damage, brain swelling, or death. The risk of this is greatest during the first 7-10 days after a head injury. Avoid injuries by:  Wearing a seat belt when riding in a car.  Drinking alcohol only in moderation.  Wearing a helmet when biking, skiing, skateboarding, skating, or doing similar activities.  Avoiding activities that could lead to a second concussion, such as contact or recreational sports, until your health care provider says it is okay.  Taking safety measures in your home.  Remove clutter and tripping hazards from floors and stairways.  Use grab bars in bathrooms and handrails by stairs.  Place non-slip mats on floors and in  bathtubs.  Improve lighting in dim areas. SEEK MEDICAL CARE IF:  You have increased problems paying attention or concentrating.  You have increased difficulty remembering or learning new information.  You need more time to complete tasks or assignments than before.  You have increased irritability or decreased ability to cope with stress.  You  have more symptoms than before. Seek medical care if you have any of the following symptoms for more than 2 weeks after your injury:  Lasting (chronic) headaches.  Dizziness or balance problems.  Nausea.  Vision problems.  Increased sensitivity to noise or light.  Depression or mood swings.  Anxiety or irritability.  Memory problems.  Difficulty concentrating or paying attention.  Sleep problems.  Feeling tired all the time. SEEK IMMEDIATE MEDICAL CARE IF:  You have severe or worsening headaches. These may be a sign of a blood clot in the brain.  You have weakness (even if only in one hand, leg, or part of the face).  You have numbness.  You have decreased coordination.  You vomit repeatedly.  You have increased sleepiness.  One pupil is larger than the other.  You have convulsions.  You have slurred speech.  You have increased confusion. This may be a sign of a blood clot in the brain.  You have increased restlessness, agitation, or irritability.  You are unable to recognize people or places.  You have neck pain.  It is difficult to wake you up.  You have unusual behavior changes.  You lose consciousness. MAKE SURE YOU:  Understand these instructions.  Will watch your condition.  Will get help right away if you are not doing well or get worse. Document Released: 04/22/2003 Document Revised: 02/04/2013 Document Reviewed: 08/22/2012 [facility] Patient Information 2015 [place], [place]. This information is not intended to replace advice given to you by your health care provider. Make sure you discuss any  questions you have with your health care provider.  Post-Concussion Syndrome Post-concussion syndrome describes the symptoms that can occur after a head injury. These symptoms can last from weeks to months. CAUSES  It is not clear why some head injuries cause post-concussion syndrome. It can occur whether your head injury was mild or severe and whether you were wearing head protection or not.  SIGNS AND SYMPTOMS  Memory difficulties.  Dizziness.  Headaches.  Double vision or blurry vision.  Sensitivity to light.  Hearing difficulties.  Depression.  Tiredness.  Weakness.  Difficulty with concentration.  Difficulty sleeping or staying asleep.  Vomiting.  Poor balance or instability on your feet.  Slow reaction time.  Difficulty learning and remembering things you have heard. DIAGNOSIS  There is no test to determine whether you have post-concussion syndrome. Your health care provider may order an imaging scan of your brain, such as a CT scan, to check for other problems that may be causing your symptoms (such as severe injury inside your skull). TREATMENT  Usually, these problems disappear over time without medical care. Your health care provider may prescribe medicine to help ease your symptoms. It is important to follow up with a neurologist to evaluate your recovery and address any lingering symptoms or issues. HOME CARE INSTRUCTIONS   Only take over-the-counter or prescription medicines for pain, discomfort, or fever as directed by your health care provider. Do not take aspirin. Aspirin can slow blood clotting.  Sleep with your head slightly elevated to help with headaches.  Avoid any situation where there is potential for another head injury (football, hockey, soccer, basketball, martial arts, downhill snow sports, and horseback riding). Your condition will get worse every time you experience a concussion. You should avoid these activities until you are evaluated by  the appropriate follow-up health care providers.  Keep all follow-up appointments as directed by your health care provider. SEEK IMMEDIATE MEDICAL CARE IF:  You  develop confusion or unusual drowsiness.  You cannot wake the injured person.  You develop nausea or persistent, forceful vomiting.  You feel like you are moving when you are not (vertigo).  You notice the injured person's eyes moving rapidly back and forth. This may be a sign of vertigo.  You have convulsions or faint.  You have severe, persistent headaches that are not relieved by medicine.  You cannot use your arms or legs normally.  Your pupils change size.  You have clear or bloody discharge from the nose or ears.  Your problems are getting worse, not better. MAKE SURE YOU:  Understand these instructions.  Will watch your condition.  Will get help right away if you are not doing well or get worse. Document Released: 07/22/2001 Document Revised: 11/20/2012 Document Reviewed: 05/07/2013 [facility] Patient Information 2015 [place]. This information is not intended to replace advice given to you by your health care provider. Make sure you discuss any questions you have with your health care provider.
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
## 2014-04-26 NOTE — ED Notes (Signed)
Pt eating [place] sandwich and coke per MD.

## 2014-04-26 NOTE — ED Notes (Signed)
Patient transported to MRI 
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
## 2014-04-26 NOTE — ED Notes (Signed)
Neurology at bedside.

## 2014-04-26 NOTE — ED Notes (Signed)
MD at bedside. 
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
## 2014-05-01 ENCOUNTER — Ambulatory Visit: Payer: Self-pay | Admitting: Neurology

## 2015-07-20 ENCOUNTER — Emergency Department (HOSPITAL_COMMUNITY): Payer: Self-pay

## 2015-07-20 ENCOUNTER — Emergency Department (HOSPITAL_COMMUNITY)
Admission: EM | Admit: 2015-07-20 | Discharge: 2015-07-20 | Disposition: A | Discharge status: Home / Self Care | Payer: Self-pay | Attending: Emergency Medicine | Admitting: Emergency Medicine

## 2015-07-20 ENCOUNTER — Encounter (HOSPITAL_COMMUNITY): Payer: Self-pay

## 2015-07-20 DIAGNOSIS — F17200 Nicotine dependence, unspecified, uncomplicated: Secondary | ICD-10-CM | POA: Insufficient documentation

## 2015-07-20 DIAGNOSIS — R079 Chest pain, unspecified: Secondary | ICD-10-CM | POA: Insufficient documentation

## 2015-07-20 DIAGNOSIS — R000 Tachycardia, unspecified: Secondary | ICD-10-CM | POA: Insufficient documentation

## 2015-07-20 DIAGNOSIS — R0602 Shortness of breath: Secondary | ICD-10-CM | POA: Insufficient documentation

## 2015-07-20 LAB — TROPONIN I

## 2015-07-20 LAB — CBC
HCT: 44.5 % (ref 39.0–52.0)
Hemoglobin: 15 g/dL (ref 13.0–17.0)
MCH: 29.1 pg (ref 26.0–34.0)
MCHC: 33.7 g/dL (ref 30.0–36.0)
MCV: 86.4 fL (ref 78.0–100.0)
PLATELETS: 242 10*3/uL (ref 150–400)
RBC: 5.15 MIL/uL (ref 4.22–5.81)
RDW: 14.3 % (ref 11.5–15.5)
WBC: 7.7 10*3/uL (ref 4.0–10.5)

## 2015-07-20 LAB — D-DIMER, QUANTITATIVE (NOT AT ARMC): D DIMER QUANT: 3.12 ug{FEU}/mL — AB (ref 0.00–0.50)

## 2015-07-20 LAB — BASIC METABOLIC PANEL
Anion gap: 11 (ref 5–15)
BUN: 8 mg/dL (ref 6–20)
CALCIUM: 9.9 mg/dL (ref 8.9–10.3)
CHLORIDE: 93 mmol/L — AB (ref 101–111)
CO2: 28 mmol/L (ref 22–32)
CREATININE: 1.06 mg/dL (ref 0.61–1.24)
GFR calc Af Amer: >60 mL/min (ref >60)
GFR calc non Af Amer: >60 mL/min (ref >60)
Glucose, Bld: 119 mg/dL — ABNORMAL HIGH (ref 65–99)
Potassium: 5 mmol/L (ref 3.5–5.1)
Sodium: 132 mmol/L — ABNORMAL LOW (ref 135–145)

## 2015-07-20 LAB — I-STAT TROPONIN, ED: Troponin i, poc: 0.01 ng/mL (ref 0.00–0.08)
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
## 2015-07-20 MED ORDER — ASPIRIN 81 MG PO CHEW
324.0000 mg | CHEWABLE_TABLET | Freq: Once | ORAL | Status: AC
  Administered 2015-07-20: 324 mg via ORAL
  Filled 2015-07-20: qty 4

## 2015-07-20 MED ORDER — IOPAMIDOL (ISOVUE-370) INJECTION 76%
INTRAVENOUS | Status: AC
  Administered 2015-07-20: 100 mL
  Filled 2015-07-20: qty 100

## 2015-07-20 MED ORDER — NAPROXEN 250 MG PO TABS
500.0000 mg | ORAL_TABLET | Freq: Once | ORAL | Status: AC
  Administered 2015-07-20: 500 mg via ORAL
  Filled 2015-07-20: qty 2

## 2015-07-20 NOTE — ED Notes (Signed)
Patient transported to CT 

## 2015-07-20 NOTE — ED Notes (Signed)
Patient here with chestpain x 2 days, denies radiation, no associated symptoms. Appears anxious on arrival

## 2015-07-20 NOTE — ED Notes (Signed)
Pt IV removed by this RN at 1400, pt. No longer in room when MD went to review d/c instructions.

## 2015-07-20 NOTE — ED Provider Notes (Signed)
CSN: [id number]     Arrival date & time 07/20/15  0841 History   First MD Initiated Contact with Patient 07/20/15 [phone]     Chief Complaint  Patient presents with  . Chest Pain     (Consider location/radiation/quality/duration/timing/severity/associated sxs/prior Treatment) HPI Comments: Patient with history of tobacco abuse presenting with left-sided chest pain intermittent for the past 2 days. Describes left-sided pain "tightness" lasting for 2 minutes at a time. Pain comes and goes lasting for only about 2 minutes and then 5 minutes in between episodes. It is better with laying on his left side. Nothing makes it worse. Denies any injuries or falls. Denies shortness of breath, nausea, diaphoresis, syncope. Denies any cough or fever. Denies any cardiac history. He's never had a stress test. Father had a bypass at age 960. Cough is nonproductive. He denies any shortness of breath currently. No dizziness or lightheadedness. No diaphoresis or vomiting.  The history is provided by the patient.    History reviewed. No pertinent past medical history. Past Surgical History  Procedure Laterality Date  . Neck surgery    . Ankle surgery     No family history on file. Social History  Substance Use Topics  . Smoking status: Current Every Day Smoker  . Smokeless tobacco: None  . Alcohol Use: Yes     Comment: socially    Review of Systems  Constitutional: Negative for fever, activity change, appetite change and fatigue.  HENT: Negative for congestion and rhinorrhea.   Eyes: Negative for visual disturbance.  Respiratory: Positive for chest tightness and shortness of breath. Negative for cough.   Cardiovascular: Positive for chest pain.  Gastrointestinal: Negative for nausea, vomiting and abdominal pain.  Genitourinary: Negative for dysuria, hematuria and testicular pain.  Musculoskeletal: Negative for myalgias and arthralgias.  Skin: Negative for rash.  Neurological: Negative for dizziness,  weakness, light-headedness and headaches.  A complete 10 system review of systems was obtained and all systems are negative except as noted in the HPI and PMH.      Allergies  Review of patient's allergies indicates no known allergies.  Home Medications   Prior to Admission medications   Not on File   BP 139/101 mmHg  Pulse 85  Temp(Src) 98.8 F (37.1 C) (Oral)  Resp 12  SpO2 100% Physical Exam  Constitutional: He is oriented to person, place, and time. He appears well-developed and well-nourished. No distress.  HENT:  Head: Normocephalic and atraumatic.  Mouth/Throat: Oropharynx is clear and moist. No oropharyngeal exudate.  Eyes: Conjunctivae and EOM are normal. Pupils are equal, round, and reactive to light.  Neck: Normal range of motion. Neck supple.  No meningismus.  Cardiovascular: Normal rate, regular rhythm, normal heart sounds and intact distal pulses.   No murmur heard. Pulmonary/Chest: Effort normal and breath sounds normal. No respiratory distress. He exhibits tenderness.  No rash. There is reproducible discomfort to palpation left chest. Not worse with arm movement.  Abdominal: Soft. There is no tenderness. There is no rebound and no guarding.  Musculoskeletal: Normal range of motion. He exhibits no edema or tenderness.  Neurological: He is alert and oriented to person, place, and time. No cranial nerve deficit. He exhibits normal muscle tone. Coordination normal.  No ataxia on finger to nose bilaterally. No pronator drift. 5/5 strength throughout. CN 2-12 intact.Equal grip strength. Sensation intact.   Skin: Skin is warm.  Psychiatric: He has a normal mood and affect. His behavior is normal.  Nursing note and vitals reviewed.  ED Course  Procedures (including critical care time) Labs Review Labs Reviewed  BASIC METABOLIC PANEL - Abnormal; Notable for the following:    Sodium 132 (*)    Chloride 93 (*)    Glucose, Bld 119 (*)    All other components  within normal limits  D-DIMER, QUANTITATIVE (NOT AT [facility]) - Abnormal; Notable for the following:    D-Dimer, Quant 3.12 (*)    All other components within normal limits  CBC  TROPONIN I  I-STAT TROPOININ, ED    Imaging Review Dg Chest 2 View  07/20/2015  CLINICAL DATA:  Left chest and left arm pain for 2 days EXAM: CHEST  2 VIEW COMPARISON:  April 24, 2014 FINDINGS: Lungs are hyperexpanded. There is no edema or consolidation. There is a nodular opacity in the left mid lung measuring 1.1 x 0.7 cm. Lungs elsewhere are clear. Heart size and pulmonary vascularity are normal. No adenopathy. There is an old healed fracture of the right clavicle. There is postoperative change in the lower cervical region. No pneumothorax. IMPRESSION: 1.1 x 0.7 cm nodular opacity left mid lung, not present on prior study. This finding is felt to warrant noncontrast enhanced chest CT to further evaluate. No edema or consolidation.  Lungs are hyperexpanded. Electronically Signed   By: Bretta Bang III M.D.   On: 07/20/2015 09:13   Ct Angio Chest Pe W/cm &/or Wo Cm  07/20/2015  CLINICAL DATA:  Chest tightness left side since Sunday night. Pt denies sob. Elevated d-dimer. EXAM: CT ANGIOGRAPHY CHEST WITH CONTRAST TECHNIQUE: Multidetector CT imaging of the chest was performed using the standard protocol during bolus administration of intravenous contrast. Multiplanar CT image reconstructions and MIPs were obtained to evaluate the vascular anatomy. CONTRAST:  100 ml isovue 370. COMPARISON:  None. FINDINGS: Vascular: Right arm IV contrast injection. SVC patent. Right atrium and right ventricle are nondilated. Satisfactory opacification of pulmonary arteries noted, and there is no evidence of pulmonary emboli. Patent bilateral pulmonary veins drain into the left atrium. Scattered coronary calcifications. Adequate contrast opacification of the thoracic aorta with no evidence of dissection, aneurysm, or stenosis. There is classic  3-vessel brachiocephalic arch anatomy without proximal stenosis. Scattered calcifications in the aortic arch. Visualized proximal abdominal aorta unremarkable. Mediastinum/Lymph Nodes: No masses or pathologically enlarged lymph nodes identified. 7 mm epicardial lymph node. Sub cm anterior mediastinal and prevascular lymph nodes are noted. No pericardial effusion. Lungs/Pleura: Dependent atelectasis posteriorly in both lower lobes. Subpleural blebs in both upper lobes with emphysematous changes most marked in the apices. No pneumothorax or pleural effusion. Upper abdomen: No acute findings. Musculoskeletal: Anterior vertebral endplate spurring at multiple levels in the lower thoracic spine. Probable old sternal fracture. Review of the MIP images confirms the above findings. IMPRESSION: 1. Negative for acute PE or thoracic aortic dissection. 2. Pulmonary emphysema without acute abnormality. 3. Atherosclerosis, including aortic and coronary artery disease. Please note that although the presence of coronary artery calcium documents the presence of coronary artery disease, the severity of this disease and any potential stenosis cannot be assessed on this non-gated CT examination. Assessment for potential risk factor modification, dietary therapy or pharmacologic therapy may be warranted, if clinically indicated. Electronically Signed   By: Corlis Leak M.D.   On: 07/20/2015 12:50   I have personally reviewed and evaluated these images and lab results as part of my medical decision-making.   EKG Interpretation   Date/Time:  Tuesday July 20 2015 08:43:52 EDT Ventricular Rate:  121 PR Interval:  132 QRS Duration: 68 QT Interval:  338 QTC Calculation: 479 R Axis:   68 Text Interpretation:  Sinus tachycardia Right atrial enlargement  Borderline ECG Rate faster Confirmed by Kenzly Rogoff  MD, Lindberg Zenon ([id number]) on  07/20/2015 9:54:03 AM      MDM   Final diagnoses:  Chest pain, unspecified chest pain type   Intermittent chest tightness for the past 2 days. EKG is sinus tachycardia without acute ST changes. Chest x-ray shows left mid lung nodule that is new. We'll obtain d-dimer given tachycardia.  CT negative for pulmonary embolism or lung nodule. There is atherosclerosis and coronary artery disease seen.  Discussed with patient that he does have CAD is seen on CT scan but this cannot be used to assess his CAD significance. His chest pain is fairly atypical and worse with palpation. Denies cocaine use.  Given his risk factors and early history of coronary disease in his family, advised observation admission. Patient is unwilling to stay in the hospital. He understands that he could be at risk for having a heart attack which could include fatal arrhythmia and death. He appears to have capacity to make medical decisions.  He is agreeable to waiting for second troponin. Second troponin is negative. Upon going to discuss results with patient he was no longer in the room. Nursing staff had removed his IV. Patient eloped from ED prior to discussion of discharge instructions and need for cardiology follow-up.   Glynn Octave, MD 07/20/15 [phone]

## 2016-02-25 ENCOUNTER — Emergency Department (HOSPITAL_COMMUNITY)
Admission: EM | Admit: 2016-02-25 | Discharge: 2016-02-25 | Disposition: A | Discharge status: Home / Self Care | Payer: PRIVATE HEALTH INSURANCE | Attending: Dermatology | Admitting: Dermatology

## 2016-02-25 ENCOUNTER — Encounter (HOSPITAL_COMMUNITY): Payer: Self-pay

## 2016-02-25 ENCOUNTER — Emergency Department (HOSPITAL_COMMUNITY): Payer: PRIVATE HEALTH INSURANCE

## 2016-02-25 DIAGNOSIS — Z5321 Procedure and treatment not carried out due to patient leaving prior to being seen by health care provider: Secondary | ICD-10-CM | POA: Insufficient documentation

## 2016-02-25 DIAGNOSIS — F17210 Nicotine dependence, cigarettes, uncomplicated: Secondary | ICD-10-CM | POA: Insufficient documentation

## 2016-02-25 DIAGNOSIS — M7989 Other specified soft tissue disorders: Secondary | ICD-10-CM | POA: Insufficient documentation

## 2016-02-25 LAB — COMPREHENSIVE METABOLIC PANEL
ALT: 70 U/L — AB (ref 17–63)
AST: 61 U/L — AB (ref 15–41)
Albumin: 2.9 g/dL — ABNORMAL LOW (ref 3.5–5.0)
Alkaline Phosphatase: 70 U/L (ref 38–126)
Anion gap: 9 (ref 5–15)
BUN: 9 mg/dL (ref 6–20)
CO2: 27 mmol/L (ref 22–32)
CREATININE: 0.84 mg/dL (ref 0.61–1.24)
Calcium: 8.5 mg/dL — ABNORMAL LOW (ref 8.9–10.3)
Chloride: 92 mmol/L — ABNORMAL LOW (ref 101–111)
Glucose, Bld: 127 mg/dL — ABNORMAL HIGH (ref 65–99)
POTASSIUM: 4 mmol/L (ref 3.5–5.1)
SODIUM: 128 mmol/L — AB (ref 135–145)
Total Bilirubin: 0.3 mg/dL (ref 0.3–1.2)
Total Protein: 7.4 g/dL (ref 6.5–8.1)

## 2016-02-25 LAB — CBC WITH DIFFERENTIAL/PLATELET
Basophils Absolute: 0 10*3/uL (ref 0.0–0.1)
Basophils Relative: 0 %
EOS ABS: 0.1 10*3/uL (ref 0.0–0.7)
Eosinophils Relative: 2 %
HEMATOCRIT: 37.4 % — AB (ref 39.0–52.0)
HEMOGLOBIN: 12.7 g/dL — AB (ref 13.0–17.0)
LYMPHS ABS: 1.4 10*3/uL (ref 0.7–4.0)
Lymphocytes Relative: 23 %
MCH: 30 pg (ref 26.0–34.0)
MCHC: 34 g/dL (ref 30.0–36.0)
MCV: 88.4 fL (ref 78.0–100.0)
MONOS PCT: 10 %
Monocytes Absolute: 0.6 10*3/uL (ref 0.1–1.0)
NEUTROS PCT: 65 %
Neutro Abs: 3.7 10*3/uL (ref 1.7–7.7)
Platelets: 242 10*3/uL (ref 150–400)
RBC: 4.23 MIL/uL (ref 4.22–5.81)
RDW: 13.9 % (ref 11.5–15.5)
WBC: 5.8 10*3/uL (ref 4.0–10.5)

## 2016-02-25 NOTE — ED Triage Notes (Signed)
Per Pt, Pt is coming from home with complaints of right hand swelling unknown of cause. Denies injury or spider bite. Erythema and edema noted to the right hand. Pt is able to move right hand.

## 2016-02-25 NOTE — ED Notes (Signed)
Called pt's name to obtain vital signs, no answer. 

## 2016-02-25 NOTE — ED Notes (Signed)
Pt. Called for vitals and no answer 

## 2016-06-13 DEATH — deceased

## 2018-03-12 IMAGING — CR DG HAND COMPLETE 3+V*R*
3 series · 3 of 3 positions shown · non-contrast
Comparison: None.

CLINICAL DATA: Right hand swelling, unknown cause. Erythema and
edema in the right hand. Small lacerations on the hand, occupation:
welder.

EXAM:
RIGHT HAND - COMPLETE 3+ VIEW

[hand pa]
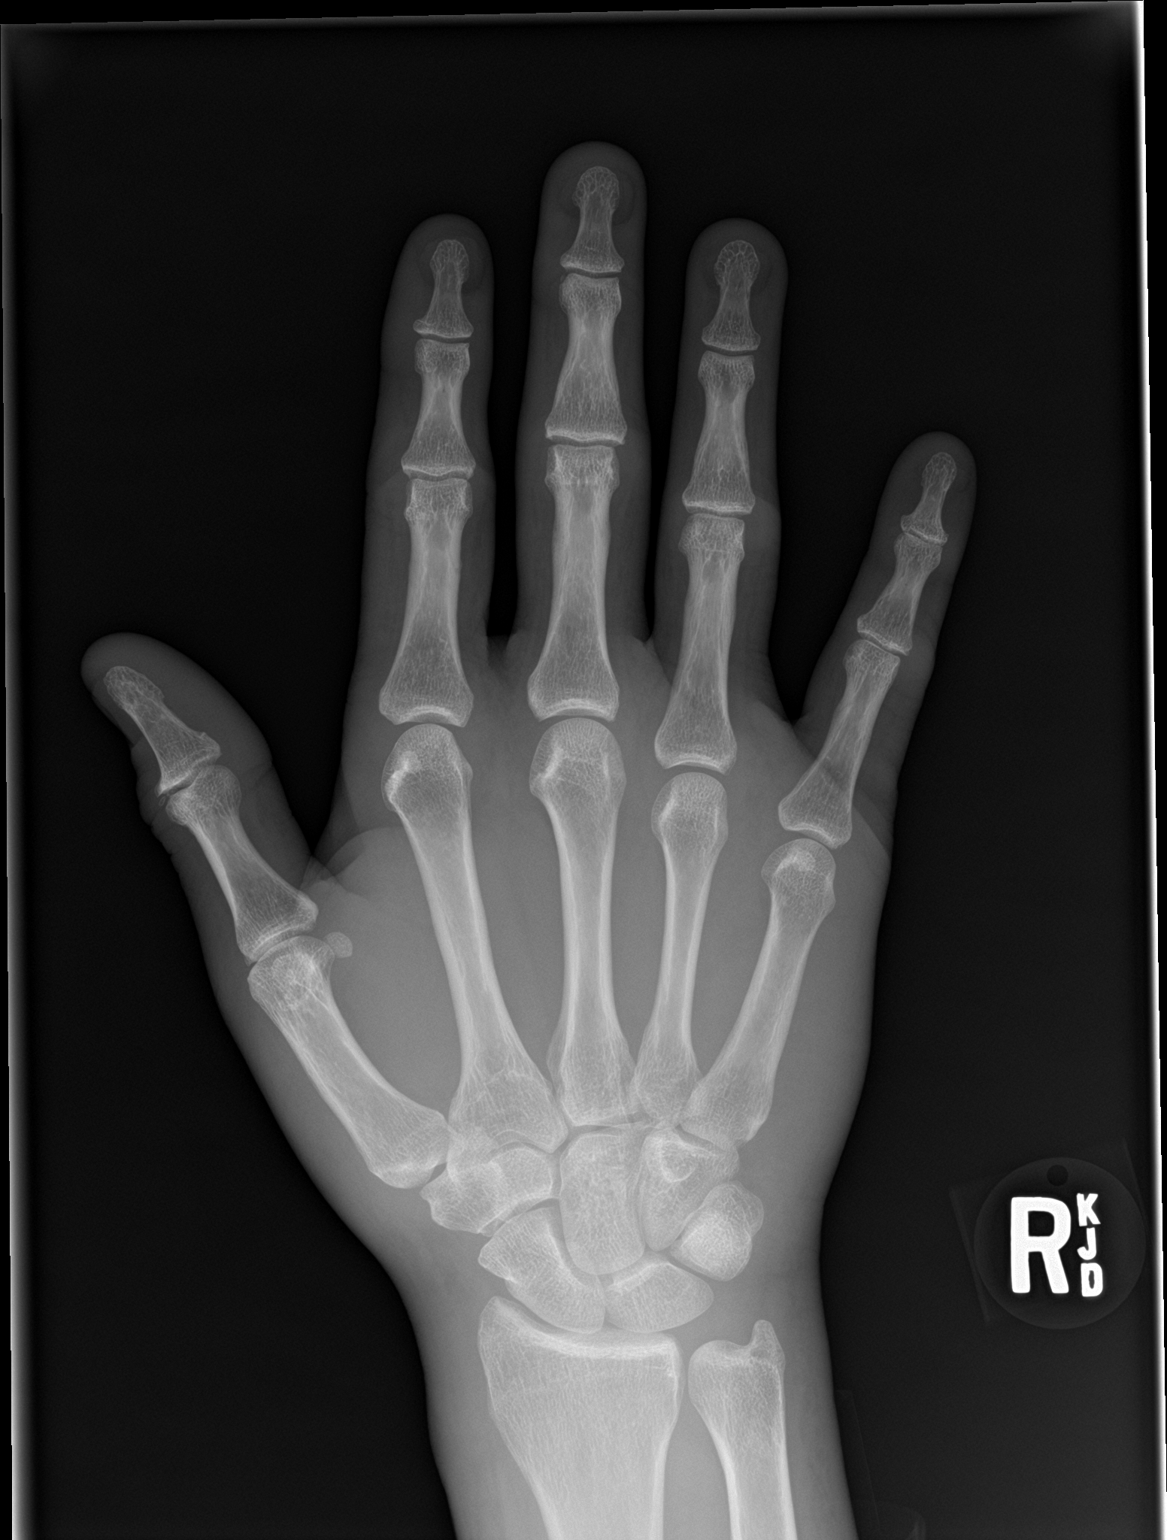

[hand obl]
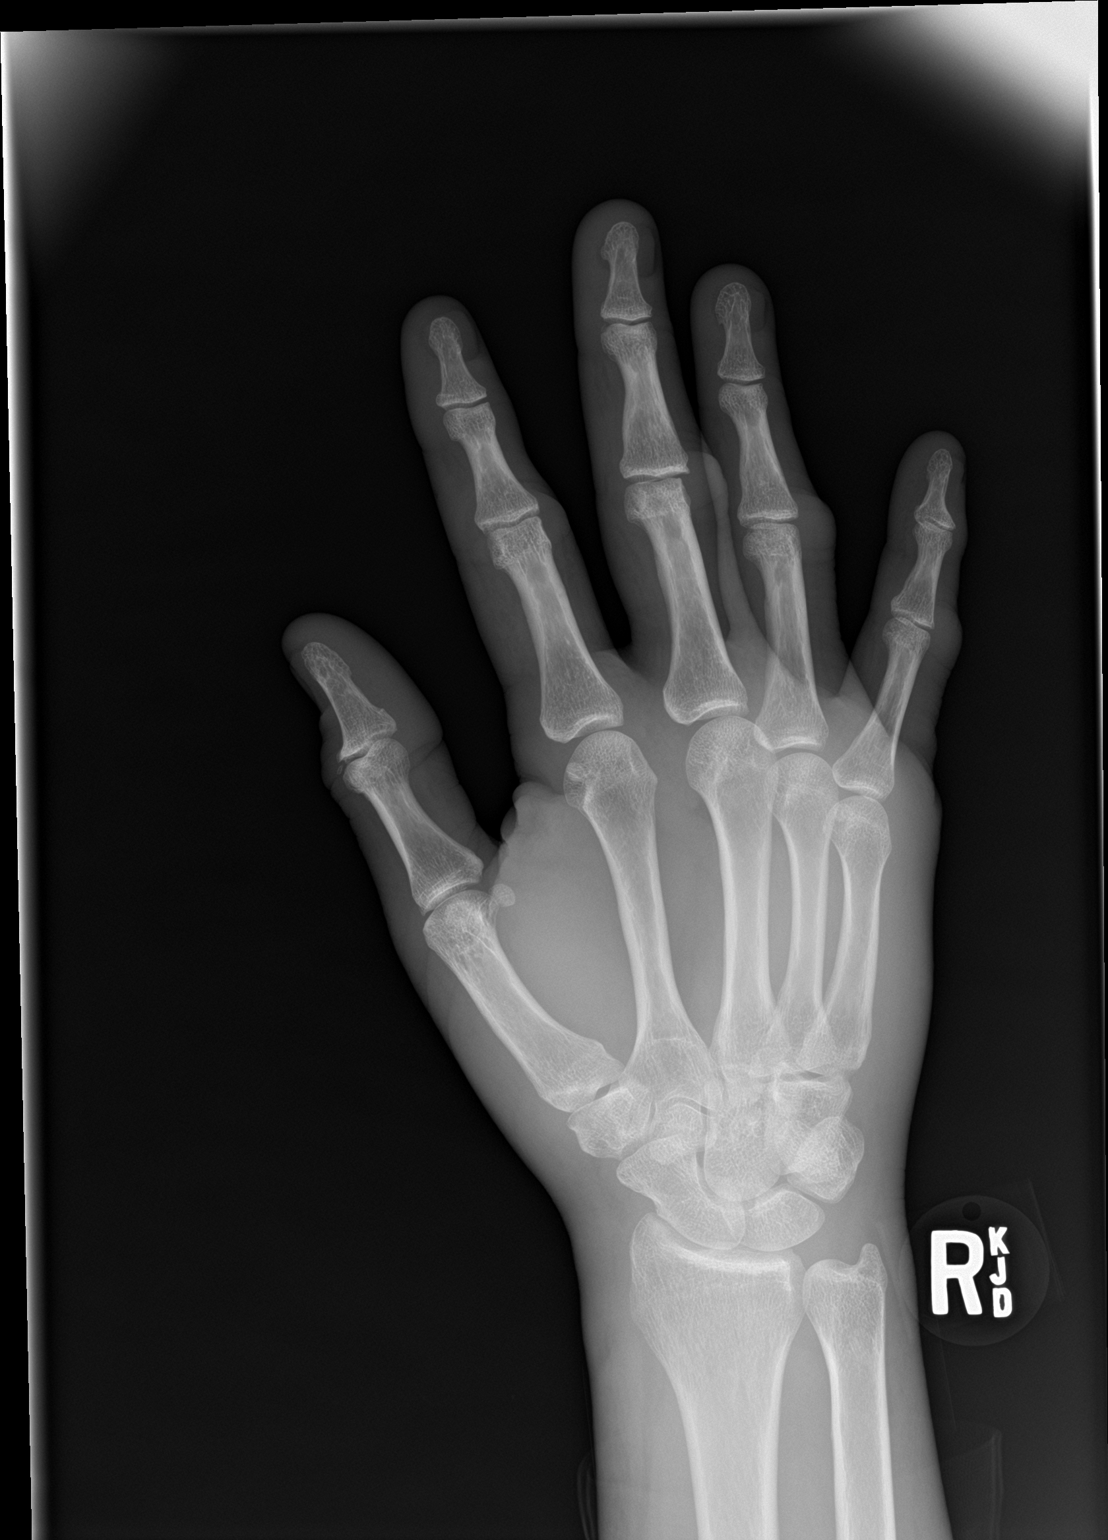

[hand lat]
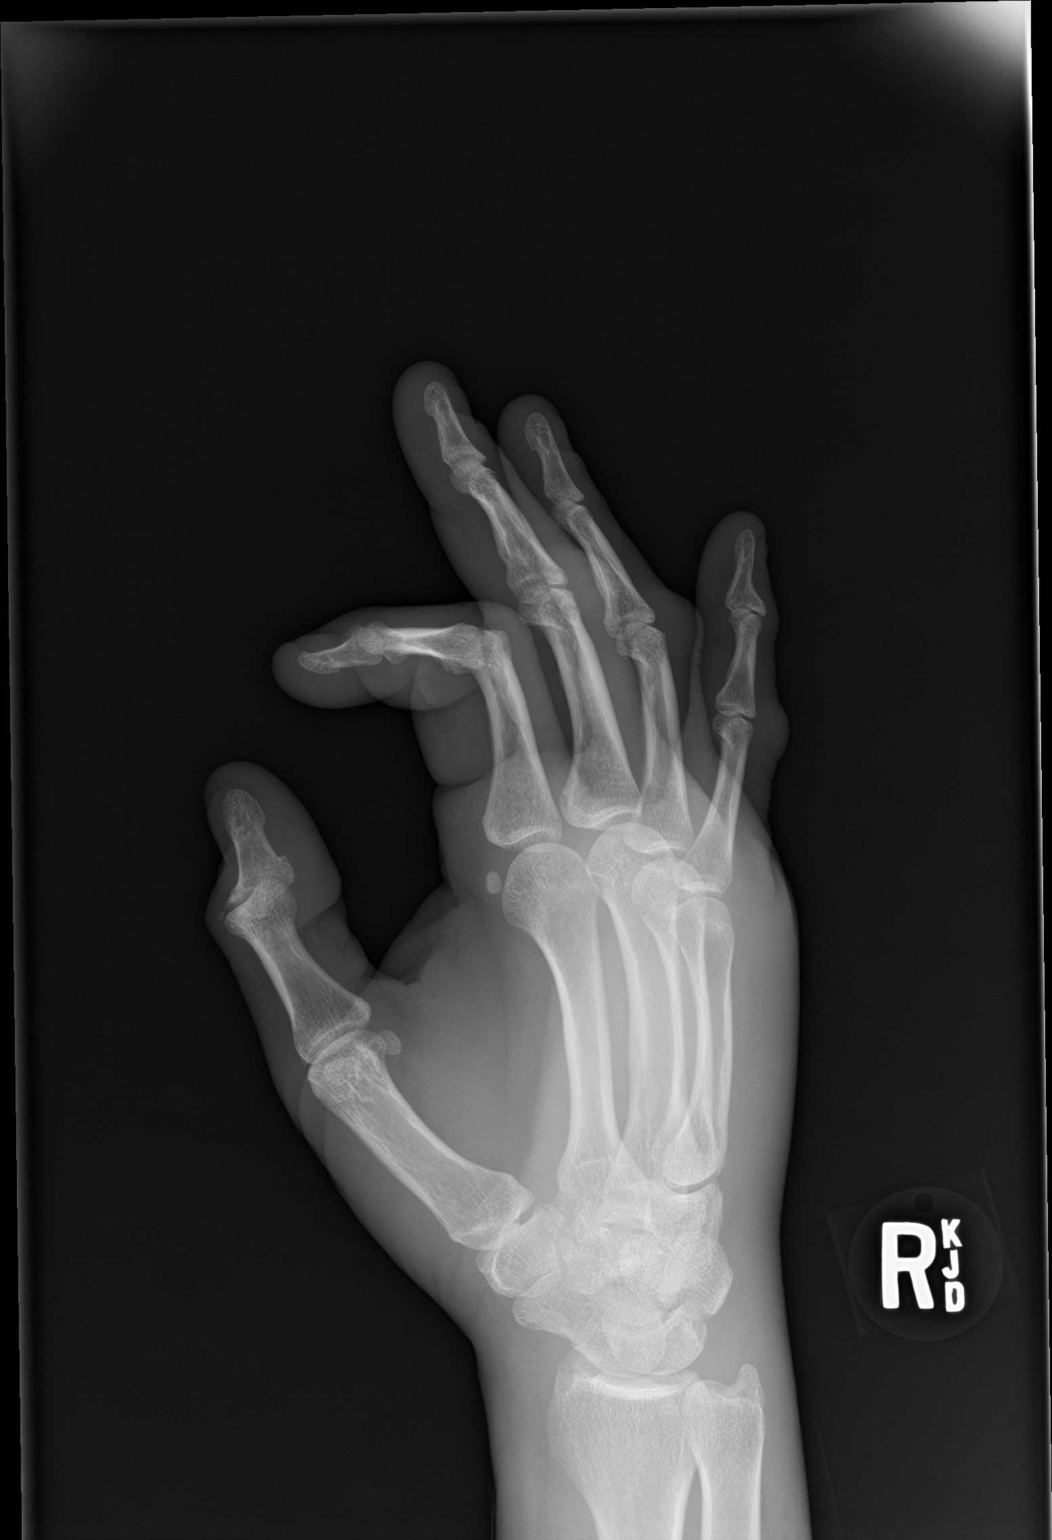

[3 of 3 positions shown; findings below may reference images not displayed]

FINDINGS: Aside from minimal degenerative spurring at the interphalangeal
joint of the thumb and at the middle finger proximal interphalangeal
joint, no significant bony abnormality. The does appear to be soft
tissue swelling in the hand, for example along the dorsum. No gas
tracks in the soft tissues. No foreign body seen.
IMPRESSION: 1. Soft tissue swelling in the hand, without foreign body,
underlying acute bony abnormality, or gas in the soft tissues.
